# Patient Record
Sex: Female | Born: 1995 | Race: White | Hispanic: No | Marital: Single | Smoking: Never smoker
Health system: Southern US, Community
[De-identification: ages and names within clinical notes are randomized; demographics above are authoritative.]

## PROBLEM LIST (undated history)

## (undated) ENCOUNTER — Emergency Department (HOSPITAL_COMMUNITY): Admission: EM | Discharge status: Absent without leave | Payer: 59

---
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
## 2017-08-14 IMAGING — US US ABDOMEN COMPLETE
1 series · 14 of 25 positions shown · non-contrast
Comparison: None available

CLINICAL DATA: Abdominal pain, epigastric, intermittent. Nausea and
vomiting x1 year

EXAM:
COMPLETE ABDOMINAL ULTRASOUND

[Series 1: us abdomen complete · 0.30mm/px · 14 of 72 slices shown]
[im 1/72]
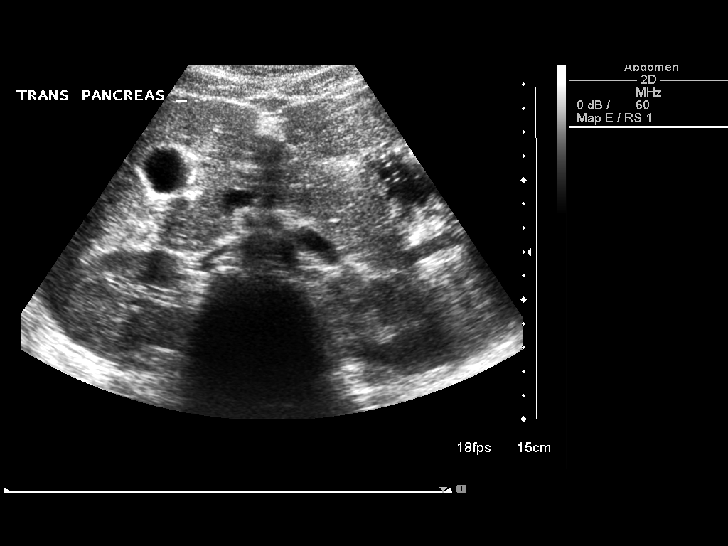
[im 6/72]
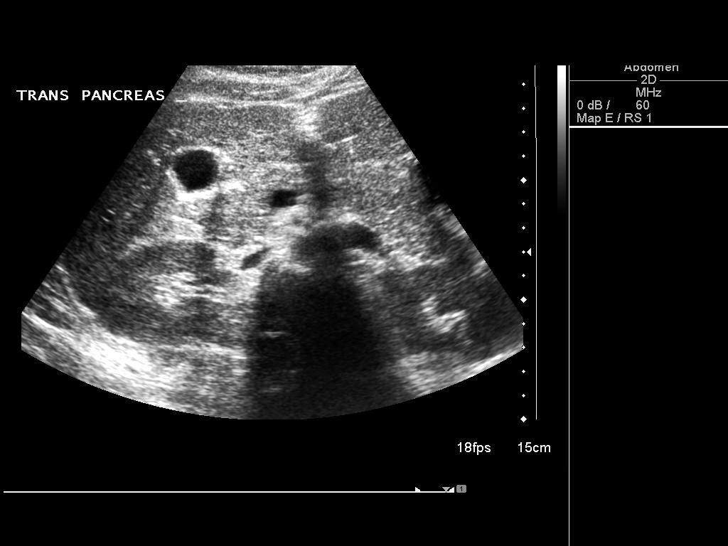
[im 12/72]
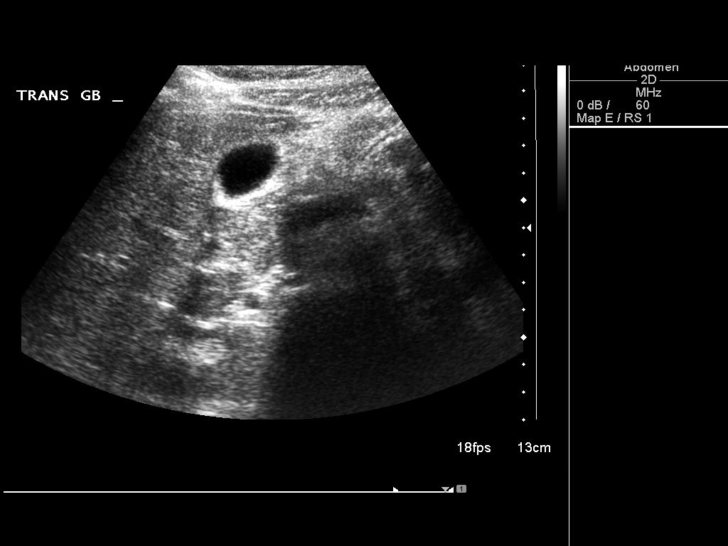
[im 18/72]
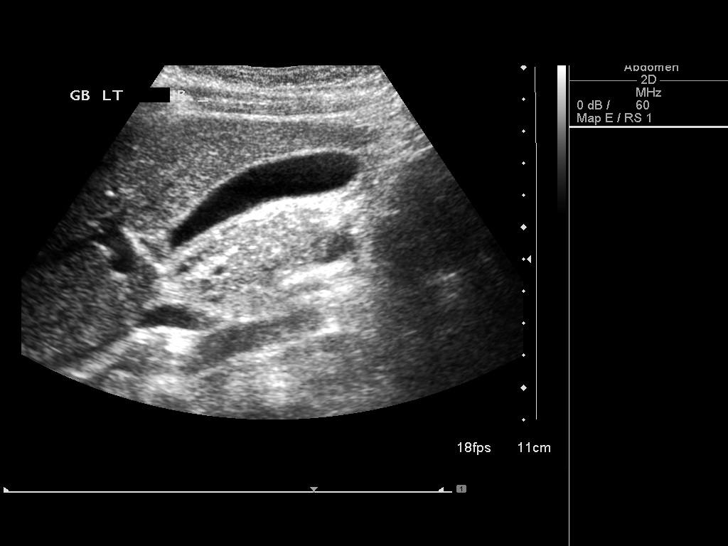
[im 24/72]
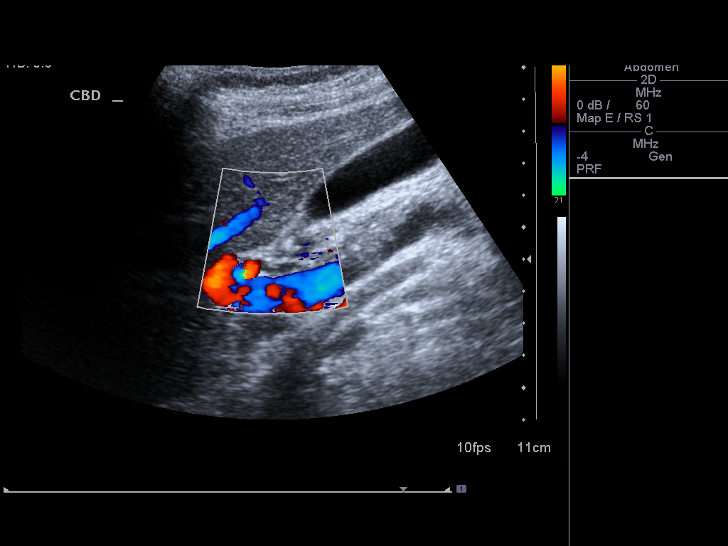
[im 27/72]
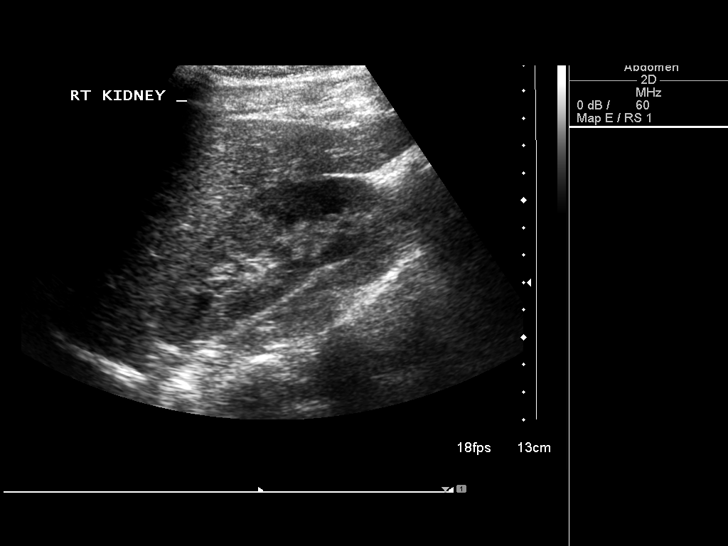
[im 33/72]
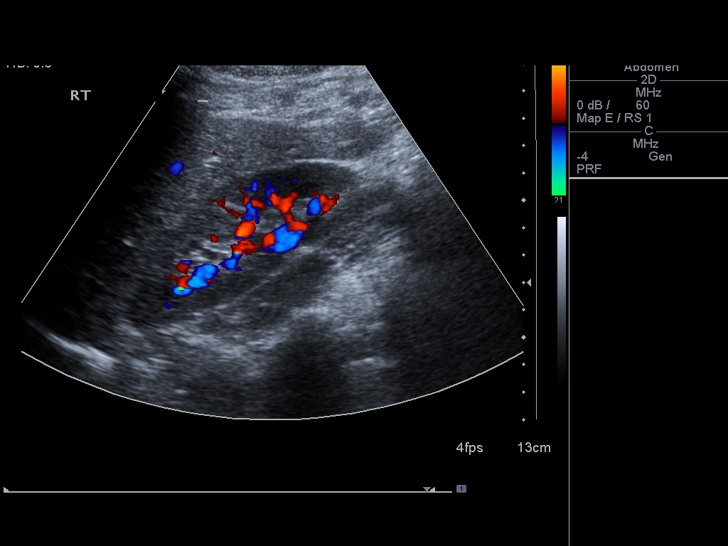
[im 39/72]
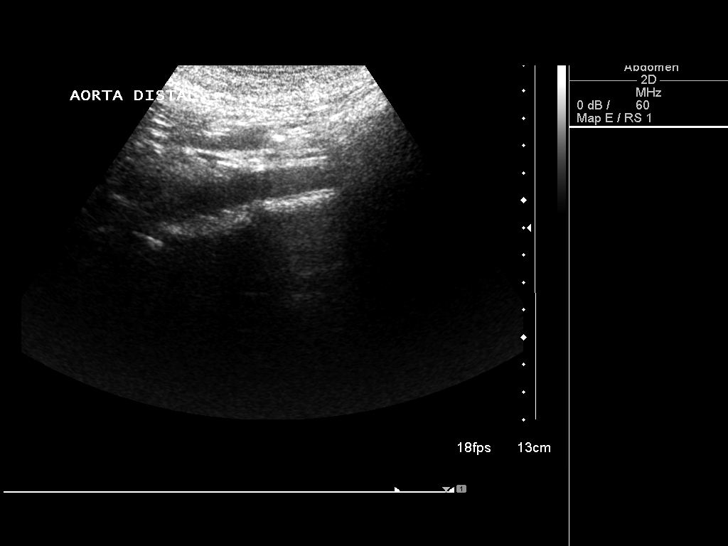
[im 45/72]
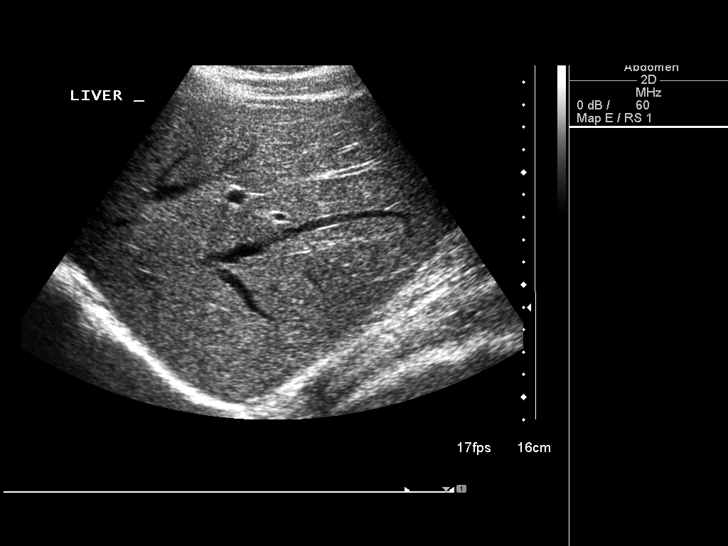
[im 48/72]
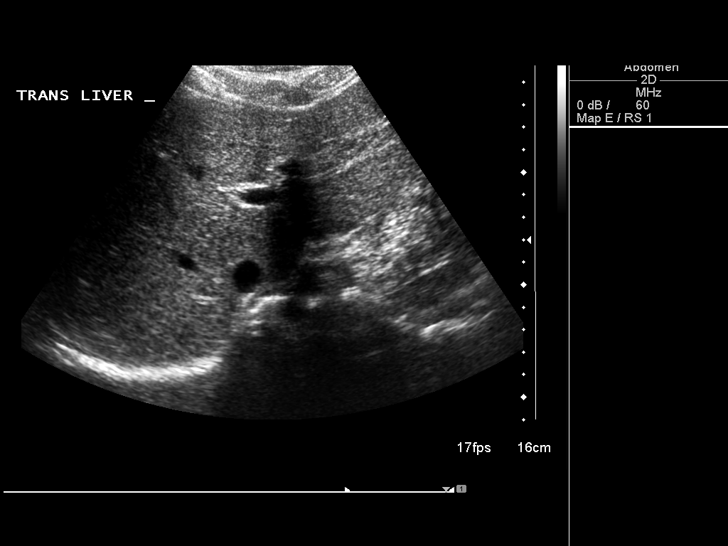
[im 54/72]
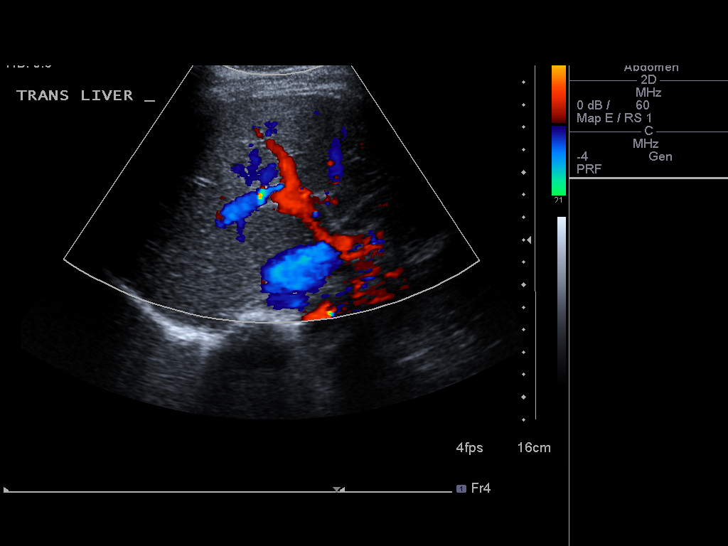
[im 60/72]
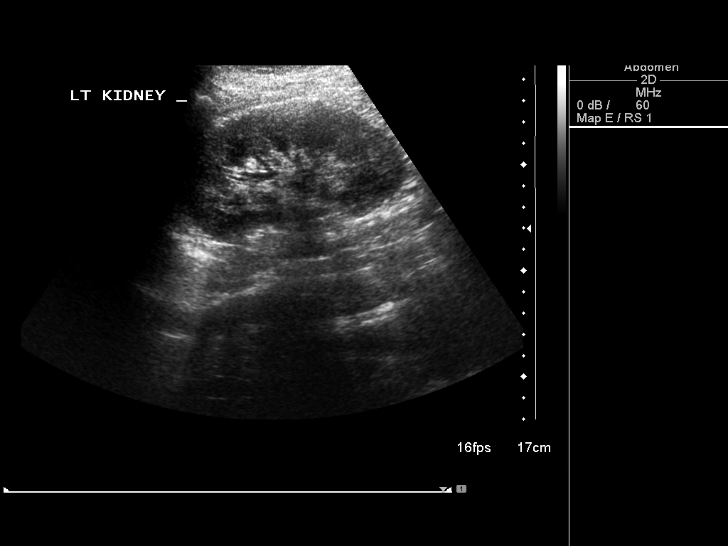
[im 66/72]
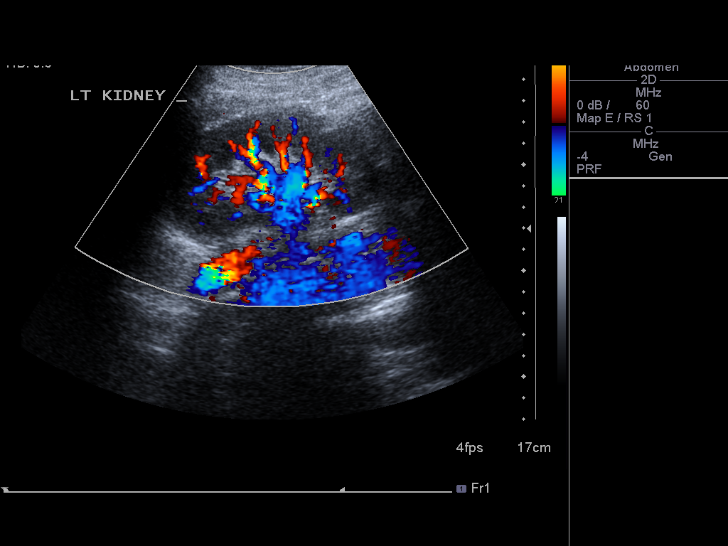
[im 72/72]
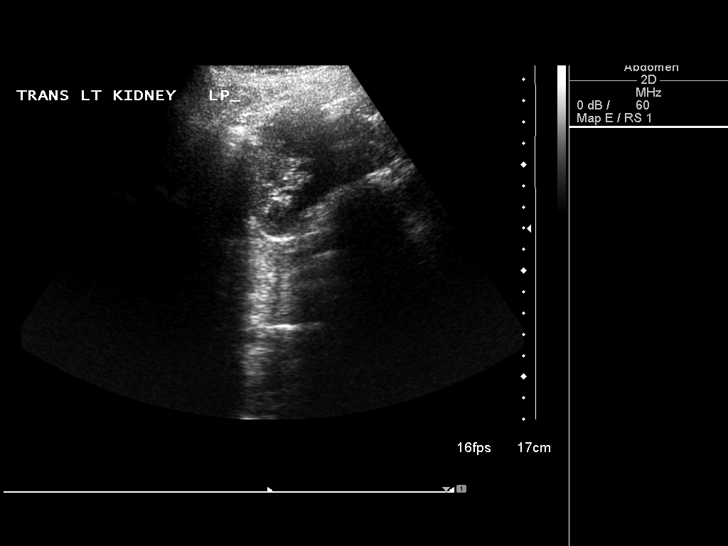

[14 of 25 positions shown; findings below may reference images not displayed]

FINDINGS: Gallbladder: Physiologically distended without stones, wall
thickening, or pericholecystic fluid. Sonographer reports no
sonographic Murphy's sign.

Common bile duct:  Normal in caliber, 4.2mm diameter.

Liver: Homogeneous in echotexture without focal lesion or
intrahepatic bile duct dilatation.

IVC:  Negative

Pancreas:  Negative

Spleen:  No focal lesion, craniocaudal 6cm in length.

Right Kidney:  No mass or hydronephrosis, 10.7cm in length.

Left Kidney:  No lesion or hydronephrosis, 10.9cm in length.

Abdominal aorta:  Negative
IMPRESSION: Negative.  Normal gallbladder.

## 2017-10-11 IMAGING — CR DG CHEST 2V
2 series · 2 of 2 positions shown · non-contrast
Comparison: None.

CLINICAL DATA: Preop evaluation for upcoming appendectomy

EXAM:
CHEST  2 VIEW

[w chest pa]
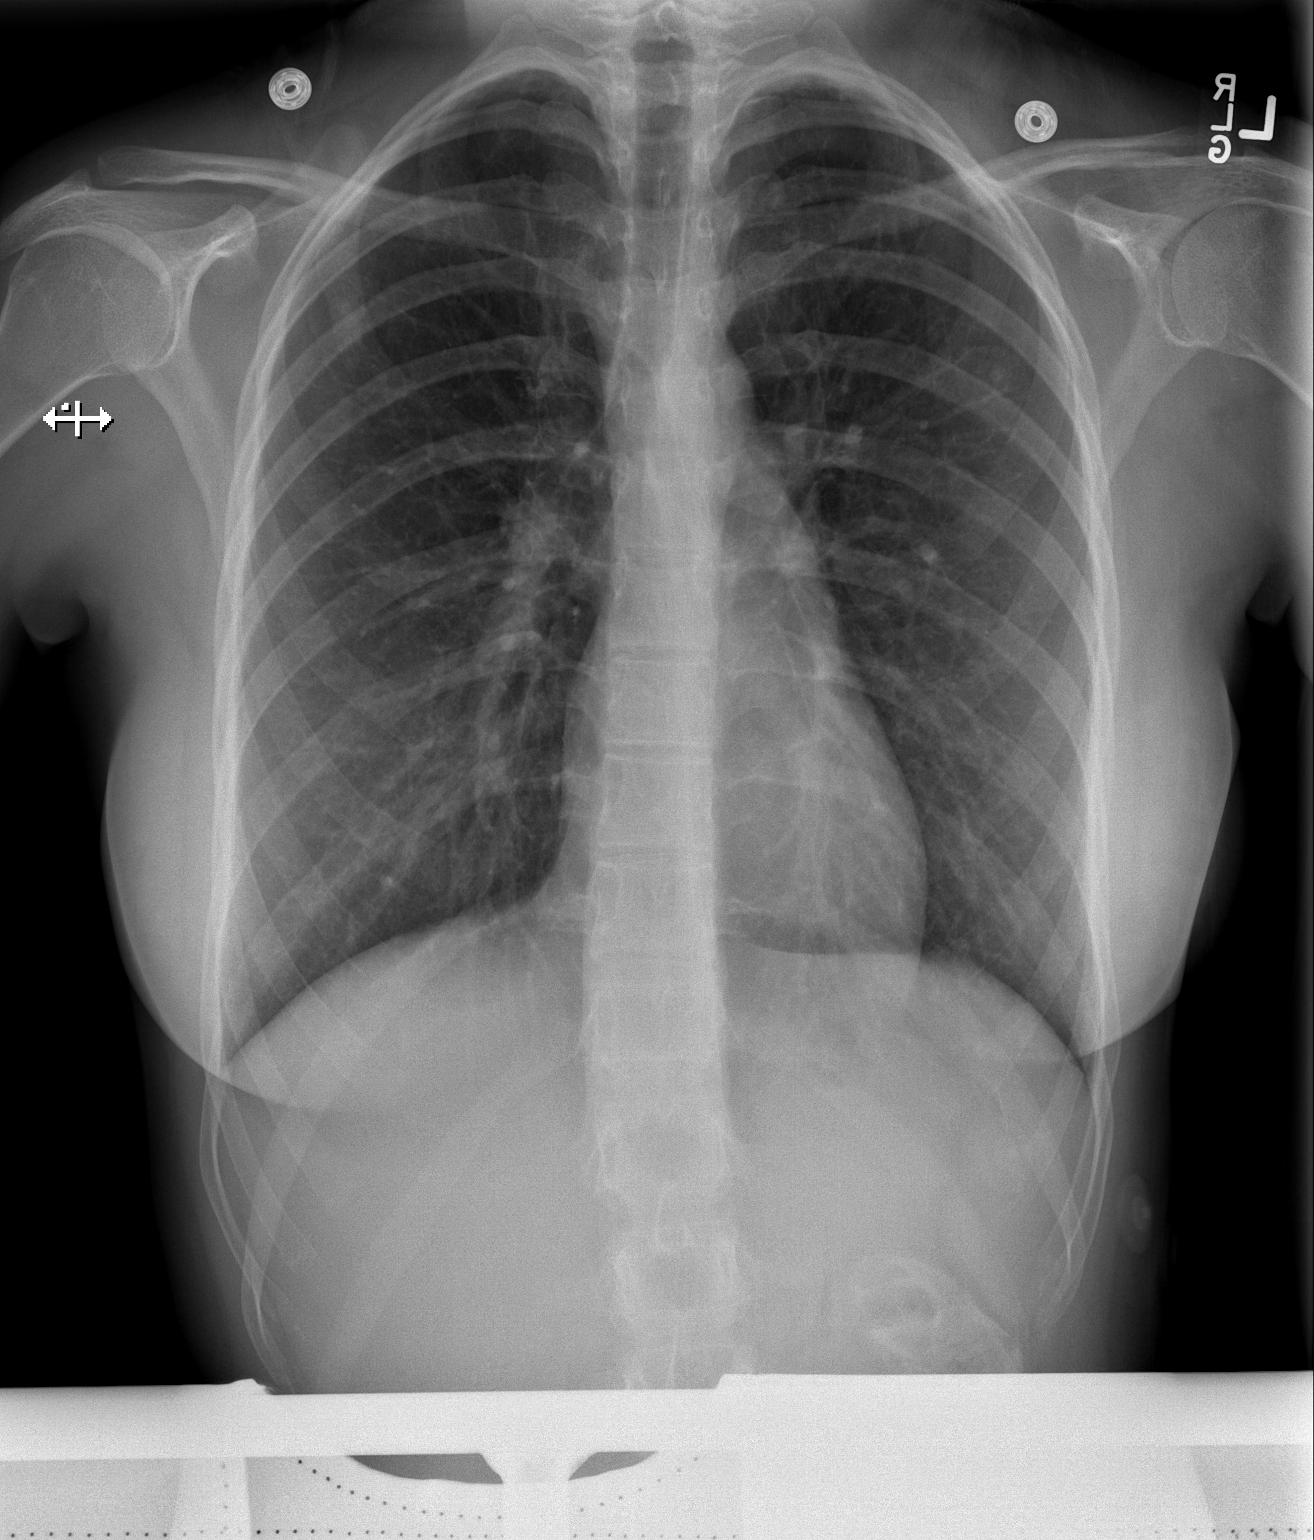

[w chest lat]
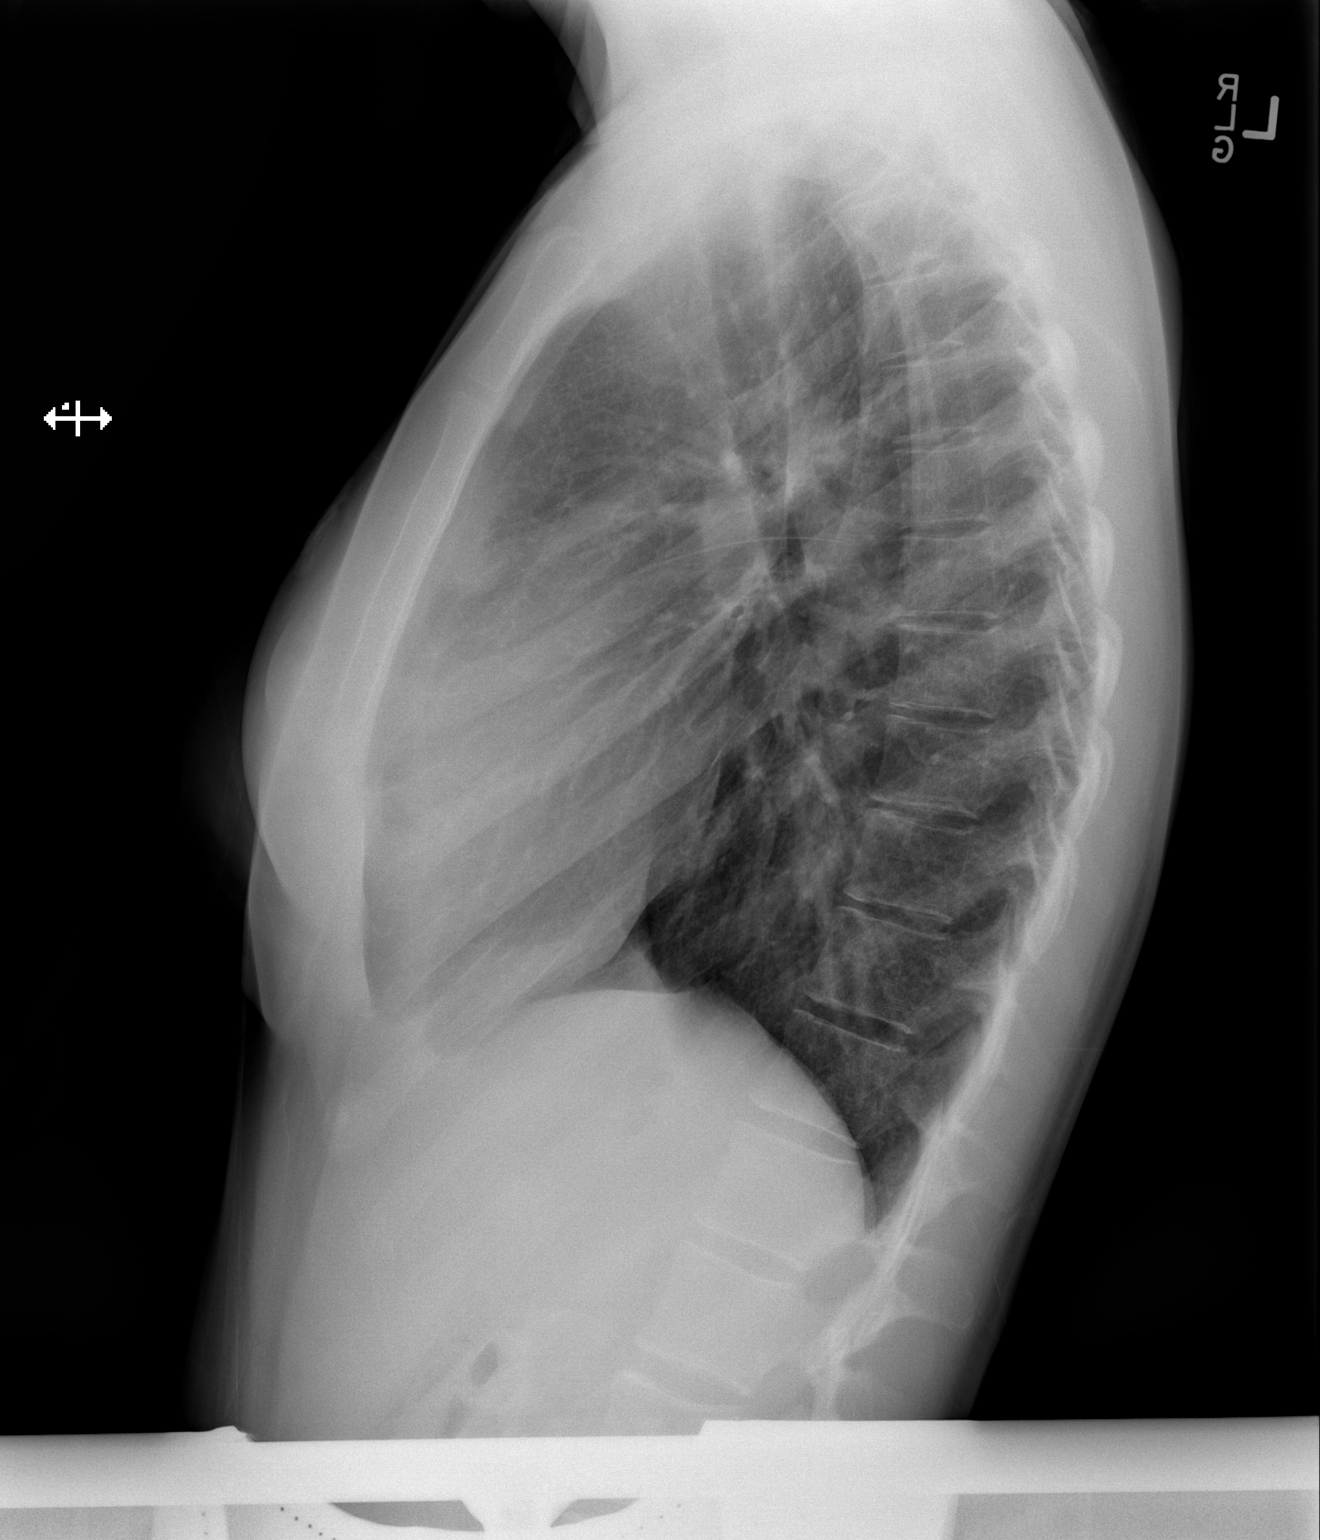

[2 of 2 positions shown; findings below may reference images not displayed]

FINDINGS: The heart size and mediastinal contours are within normal limits.
Both lungs are clear. The visualized skeletal structures are
unremarkable.
IMPRESSION: No active cardiopulmonary disease.
